# Patient Record
Sex: Male | Born: 2000 | Race: Black or African American | Hispanic: No | Marital: Single | Smoking: Never smoker
Health system: Southern US, Community
[De-identification: ages and names within clinical notes are randomized; demographics above are authoritative.]

## PROBLEM LIST (undated history)

## (undated) DIAGNOSIS — E669 Obesity, unspecified: Secondary | ICD-10-CM

## (undated) DIAGNOSIS — F8081 Childhood onset fluency disorder: Secondary | ICD-10-CM

## (undated) DIAGNOSIS — F39 Unspecified mood [affective] disorder: Secondary | ICD-10-CM

## (undated) DIAGNOSIS — R51 Headache: Secondary | ICD-10-CM

## (undated) DIAGNOSIS — F913 Oppositional defiant disorder: Secondary | ICD-10-CM

## (undated) DIAGNOSIS — E663 Overweight: Principal | ICD-10-CM

## (undated) DIAGNOSIS — R519 Headache, unspecified: Secondary | ICD-10-CM

## (undated) DIAGNOSIS — F909 Attention-deficit hyperactivity disorder, unspecified type: Secondary | ICD-10-CM

## (undated) DIAGNOSIS — E739 Lactose intolerance, unspecified: Secondary | ICD-10-CM

## (undated) DIAGNOSIS — J353 Hypertrophy of tonsils with hypertrophy of adenoids: Secondary | ICD-10-CM

## (undated) DIAGNOSIS — T7840XA Allergy, unspecified, initial encounter: Secondary | ICD-10-CM

## (undated) DIAGNOSIS — F319 Bipolar disorder, unspecified: Secondary | ICD-10-CM

## (undated) HISTORY — DX: Overweight: E66.3

## (undated) HISTORY — DX: Headache: R51

## (undated) HISTORY — PX: TONSILLECTOMY: SUR1361

## (undated) HISTORY — DX: Obesity, unspecified: E66.9

## (undated) HISTORY — PX: CIRCUMCISION: SHX1350

## (undated) HISTORY — PX: ADENOIDECTOMY: SUR15

## (undated) HISTORY — DX: Attention-deficit hyperactivity disorder, unspecified type: F90.9

## (undated) HISTORY — DX: Headache, unspecified: R51.9

## (undated) HISTORY — DX: Allergy, unspecified, initial encounter: T78.40XA

---
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
## 2019-11-09 ENCOUNTER — Ambulatory Visit: Payer: Medicaid Other

## 2020-12-24 ENCOUNTER — Encounter: Payer: Self-pay | Admitting: Pediatrics

## 2021-04-17 ENCOUNTER — Encounter (HOSPITAL_COMMUNITY): Payer: Self-pay

## 2021-04-17 ENCOUNTER — Other Ambulatory Visit: Payer: Self-pay

## 2021-04-17 ENCOUNTER — Emergency Department (HOSPITAL_COMMUNITY)
Admission: EM | Admit: 2021-04-17 | Discharge: 2021-04-17 | Disposition: A | Discharge status: Home / Self Care | Payer: Medicaid Other | Attending: Emergency Medicine | Admitting: Emergency Medicine

## 2021-04-17 DIAGNOSIS — Z7722 Contact with and (suspected) exposure to environmental tobacco smoke (acute) (chronic): Secondary | ICD-10-CM | POA: Insufficient documentation

## 2021-04-17 DIAGNOSIS — Z20822 Contact with and (suspected) exposure to covid-19: Secondary | ICD-10-CM | POA: Diagnosis not present

## 2021-04-17 DIAGNOSIS — J069 Acute upper respiratory infection, unspecified: Secondary | ICD-10-CM | POA: Insufficient documentation

## 2021-04-17 DIAGNOSIS — R0981 Nasal congestion: Secondary | ICD-10-CM | POA: Diagnosis present

## 2021-04-17 LAB — RESP PANEL BY RT-PCR (FLU A&B, COVID) ARPGX2
Influenza A by PCR: NEGATIVE
Influenza B by PCR: NEGATIVE
SARS Coronavirus 2 by RT PCR: NEGATIVE

## 2021-04-17 MED ORDER — ACETAMINOPHEN 325 MG PO TABS
650.0000 mg | ORAL_TABLET | Freq: Once | ORAL | Status: AC
Start: 2021-04-17 — End: 2021-04-17
  Administered 2021-04-17: 650 mg via ORAL
  Filled 2021-04-17: qty 2

## 2021-04-17 NOTE — ED Triage Notes (Signed)
Pt presents to ED with complaints of nasal congestion, runny nose and chills started yesterday.

## 2021-04-17 NOTE — Discharge Instructions (Signed)
°  We think what you have is a viral syndrome - the treatment for which is symptomatic relief only, and your body will fight the infection off in a few days. °We are prescribing you some meds for pain and fevers. °See your primary care doctor in 1 week if the symptoms dont improve. °

## 2021-04-17 NOTE — ED Provider Notes (Signed)
[facility] EMERGENCY DEPARTMENT Provider Note   CSN: [id number] Arrival date & time: 04/17/21  1809     History Chief Complaint  Patient presents with   Nasal Congestion    Steven French is a 20 y.o. male.  HPI     20 year old male comes in with chief complaint of nasal congestion.  He reports that he has been having some URI-like symptoms starting yesterday.  He has been having subjective fevers and chills.  No sick contacts.  No history of asthma. Past Medical History:  Diagnosis Date   ADHD (attention deficit hyperactivity disorder)    Allergy    Bipolar 1 disorder (HCC)    Headache    Lactose intolerance    Mood disorder (HCC)    Obesity    ODD (oppositional defiant disorder)    Overweight(278.02) 09/24/2012   Stuttering    receives speech therapy   Tonsillar and adenoid hypertrophy 10/2012   snores during sleep, occ. 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/02/2017   Perennial allergic rhinitis 09/02/2016   Chest pain, non-cardiac 08/24/2015   Slow transit constipation 03/27/2015   BMI (body mass index), pediatric, greater than or equal to 95% for age 67/01/2015   Need for vaccination 12/22/2014   Short stature 12/22/2014   Attention deficit hyperactivity disorder (ADHD), combined type 02/09/2014   Chronic tension-type headache, not intractable 02/09/2014   ADHD (attention deficit hyperactivity disorder), combined type 02/09/2014   Migraine without aura and without status migrainosus, not intractable 09/09/2013   Tension type headache 09/09/2013   Alteration of awareness 09/09/2013   Learning difficulty 09/09/2013   ODD (oppositional defiant disorder) 09/09/2013   Academic skill disorder 09/09/2013    Past Surgical History:  Procedure Laterality Date   ADENOIDECTOMY     CIRCUMCISION     TONSILLECTOMY     TONSILLECTOMY AND ADENOIDECTOMY N/A  10/25/2012   Procedure: TONSILLECTOMY AND ADENOIDECTOMY;  Surgeon: Darletta Moll, MD;  Location: [facility];  Service: ENT;  Laterality: N/A;       Family History  Problem Relation Age of Onset   Hypertension Mother    Hyperlipidemia Mother    Seizures Mother    Bipolar disorder Mother    Schizophrenia Mother    Depression Mother    Anxiety disorder Mother    Migraines Mother    Asthma Brother    Seizures Brother        1 Maternal 1/2 brother has seizures   Migraines Brother        1 Maternal 1/2 brother has migraines   ADD / ADHD Brother        2 Maternal 1/2 brothers have ADHD   Seizures Maternal Grandfather    ADD / ADHD Maternal Grandfather    Bipolar disorder Maternal Grandfather    Depression Maternal Grandfather    Seizures Other     Social History   Tobacco Use   Smoking status: Never    Passive exposure: Yes   Smokeless tobacco: Never   Tobacco comments:    mother smokes outside  Substance Use Topics   Alcohol use: No   Drug use: Yes    Types: Marijuana    Home Medications Prior to Admission medications   Medication Sig Start Date End Date Taking? Authorizing Provider  amphetamine-dextroamphetamine (ADDERALL XR) 10 MG 24 hr capsule Take 10 mg by mouth daily.  [provider]  cetirizine (ZYRTEC) 10 MG tablet Take one tablet at night for allergies 09/02/16   McDonell, Alfredia Client, MD  flintstones complete (FLINTSTONES) 60 MG chewable tablet Chew 1 tablet by mouth daily.    [provider]  fluticasone (FLONASE) 50 MCG/ACT nasal spray INSTILL 2 SPRAYS INTO EACH NOSTRIL DAILY. 10/15/17   McDonell, Alfredia Client, MD  guanFACINE (INTUNIV) 2 MG TB24 SR tablet Take 2 mg by mouth daily.    [provider]  loratadine-pseudoephedrine (CLARITIN-D 12 HOUR) 5-120 MG per tablet Take 1 tablet by mouth 2 (two) times daily. 06/19/14   Ivery Quale, PA-C  lurasidone (LATUDA) 80 MG TABS tablet Take 1 tablet (80 mg total) by mouth daily. 04/30/15    McDonell, Alfredia Client, MD  olopatadine (PATANOL) 0.1 % ophthalmic solution INSTILL 1 DROP INTO BOTH EYES TWICE DAILY. 10/15/17   McDonell, Alfredia Client, MD  polyethylene glycol powder (GLYCOLAX/MIRALAX) powder Take 17 g by mouth 2 (two) times daily. 03/27/15   Lurene Shadow, MD  topiramate (TOPAMAX) 25 MG tablet Take 1 tablet (25 mg total) by mouth at bedtime. 02/09/14   Keturah Shavers, MD    Allergies    Penicillins and Lactose intolerance (gi)  Review of Systems   Review of Systems  Constitutional:  Positive for activity change.  HENT:  Positive for congestion.   Respiratory:  Negative for shortness of breath.   Allergic/Immunologic: Negative for immunocompromised state.   Physical Exam Updated Vital Signs BP 135/89   Pulse (!) 102   Temp 99.5 F (37.5 C) (Oral)   Resp 18   Ht 5\' 3"  (1.6 m)   Wt 83.2 kg   SpO2 99%   BMI 32.49 kg/m   Physical Exam Vitals and nursing note reviewed.  Constitutional:      Appearance: He is well-developed.  HENT:     Head: Atraumatic.  Cardiovascular:     Rate and Rhythm: Normal rate.  Pulmonary:     Effort: Pulmonary effort is normal.  Musculoskeletal:     Cervical back: Neck supple.  Skin:    General: Skin is warm.  Neurological:     Mental Status: He is alert and oriented to person, place, and time.    ED Results / Procedures / Treatments   Labs (all labs ordered are listed, but only abnormal results are displayed) Labs Reviewed  RESP PANEL BY RT-PCR (FLU A&B, COVID) [id number]    EKG None  Radiology No results found.  Procedures Procedures   Medications Ordered in ED Medications  acetaminophen (TYLENOL) tablet 650 mg (650 mg Oral Given 04/17/21 2257)    ED Course  I have reviewed the triage vital signs and the nursing notes.  Pertinent labs & imaging results that were available during my care of the patient were reviewed by me and considered in my medical decision making (see chart for details).    MDM  Rules/Calculators/A&P                           20 year old male comes in with chief complaint of cough, congestion, subjective fevers and chills.  He has cervical lymphadenopathy, otherwise he does not have any concerning exam findings.  He is healthy.  COVID-19, flu test is negative.  Stable for discharge with likely a viral URI.  Final Clinical Impression(s) / ED Diagnoses Final diagnoses:  Viral upper respiratory tract infection    Rx / DC Orders ED Discharge Orders  None        Derwood Kaplan, MD 04/17/21 2328

## 2024-02-26 ENCOUNTER — Emergency Department (HOSPITAL_COMMUNITY)
Admission: EM | Admit: 2024-02-26 | Discharge: 2024-02-26 | Disposition: A | Discharge status: Home / Self Care | Payer: MEDICAID | Attending: Emergency Medicine | Admitting: Emergency Medicine

## 2024-02-26 ENCOUNTER — Emergency Department (HOSPITAL_COMMUNITY): Payer: MEDICAID

## 2024-02-26 DIAGNOSIS — M546 Pain in thoracic spine: Secondary | ICD-10-CM | POA: Insufficient documentation

## 2024-02-26 DIAGNOSIS — M549 Dorsalgia, unspecified: Secondary | ICD-10-CM

## 2024-02-26 DIAGNOSIS — M545 Low back pain, unspecified: Secondary | ICD-10-CM | POA: Diagnosis not present

## 2024-02-26 MED ORDER — NAPROXEN 500 MG PO TABS: 500.0000 mg | ORAL_TABLET | Freq: Two times a day (BID) | ORAL | 0 refills | Status: AC

## 2024-02-26 MED ORDER — LIDOCAINE 5 % EX PTCH
1.0000 | MEDICATED_PATCH | CUTANEOUS | Status: DC
  Administered 2024-02-26: 1 via TRANSDERMAL
  Filled 2024-02-26: qty 1

## 2024-02-26 MED ORDER — KETOROLAC TROMETHAMINE 30 MG/ML IJ SOLN
30.0000 mg | Freq: Once | INTRAMUSCULAR | Status: AC
  Administered 2024-02-26: 30 mg via INTRAMUSCULAR
  Filled 2024-02-26: qty 1

## 2024-02-26 MED ORDER — LIDOCAINE 5 % EX PTCH: 1.0000 | MEDICATED_PATCH | CUTANEOUS | 0 refills | Status: AC

## 2024-02-26 NOTE — ED Provider Notes (Signed)
 [place] EMERGENCY DEPARTMENT AT [facility] Provider Note   CSN: [id number] Arrival date & time: 02/26/24  1040     Patient presents with: Back Pain   Steven French is a 23 y.o. male with no prior orthopedic concerns presenting for evaluation of mid to lower midline back pain which has been present for the past 3 to 4 weeks.  Mother at bedside states he has had intermittent complaints of pain in his back since he was a child but over the past several weeks he has been more uncomfortable.  He denies any injuries, specifically no falls or heavy lifting.  He also has no weakness or numbness in his extremities and denies any urinary fecal incontinence or retention.  No fevers or chills, no IV DU.  His mother gave him a gabapentin tablet yesterday with equivocal symptom relief.   The history is provided by the patient and a parent.       Prior to Admission medications   Medication Sig Start Date End Date Taking? Authorizing Provider  naproxen  (NAPROSYN ) 500 MG tablet Take 1 tablet (500 mg total) by mouth 2 (two) times daily. 02/26/24  Yes Leif Loflin, PA-C  amphetamine-dextroamphetamine (ADDERALL XR) 10 MG 24 hr capsule Take 10 mg by mouth daily.    [provider]  cetirizine  (ZYRTEC ) 10 MG tablet Take one tablet at night for allergies 09/02/16   McDonell, Ronal Amble, MD  flintstones complete (FLINTSTONES) 60 MG chewable tablet Chew 1 tablet by mouth daily.    [provider]  fluticasone  (FLONASE ) 50 MCG/ACT nasal spray INSTILL 2 SPRAYS INTO EACH NOSTRIL DAILY. 10/15/17   McDonell, Ronal Amble, MD  guanFACINE (INTUNIV) 2 MG TB24 SR tablet Take 2 mg by mouth daily.    [provider]  loratadine -pseudoephedrine  (CLARITIN -D 12 HOUR) 5-120 MG per tablet Take 1 tablet by mouth 2 (two) times daily. 06/19/14   Armida Culver, PA-C  lurasidone  (LATUDA ) 80 MG TABS tablet Take 1 tablet (80 mg total) by mouth daily. 04/30/15   McDonell, Ronal Amble, MD  olopatadine   (PATANOL) 0.1 % ophthalmic solution INSTILL 1 DROP INTO BOTH EYES TWICE DAILY. 10/15/17   McDonell, Ronal Amble, MD  polyethylene glycol powder (GLYCOLAX /MIRALAX ) powder Take 17 g by mouth 2 (two) times daily. 03/27/15   Gnanasekaran, Kavithashree, MD  topiramate  (TOPAMAX ) 25 MG tablet Take 1 tablet (25 mg total) by mouth at bedtime. 02/09/14   Corinthia Blossom, MD    Allergies: Penicillins and Lactose intolerance (gi)    Review of Systems  Constitutional:  Negative for fever.  Respiratory:  Negative for shortness of breath.   Cardiovascular:  Negative for chest pain and leg swelling.  Gastrointestinal:  Negative for abdominal distention, abdominal pain and constipation.  Genitourinary:  Negative for difficulty urinating, dysuria, flank pain, frequency and urgency.  Musculoskeletal:  Positive for back pain. Negative for gait problem and joint swelling.  Skin:  Negative for rash.  Neurological:  Negative for weakness and numbness.    Updated Vital Signs BP (!) 148/80   Pulse 76   Temp 98.5 F (36.9 C)   Resp 18   Ht 5' 3 (1.6 m)   Wt 84.4 kg   SpO2 98%   BMI 32.95 kg/m   Physical Exam Vitals and nursing note reviewed.  Constitutional:      Appearance: He is well-developed.  HENT:     Head: Normocephalic.  Eyes:     Conjunctiva/sclera: Conjunctivae normal.  Cardiovascular:  Rate and Rhythm: Normal rate.     Pulses: Normal pulses.     Comments: Pedal pulses normal. Pulmonary:     Effort: Pulmonary effort is normal.  Abdominal:     General: Bowel sounds are normal. There is no distension.     Palpations: Abdomen is soft. There is no mass.  Musculoskeletal:        General: Normal range of motion.     Cervical back: Normal range of motion and neck supple.     Thoracic back: Tenderness present. No swelling, edema, deformity or spasms.     Lumbar back: Tenderness present. No swelling, edema or spasms.  Skin:    General: Skin is warm and dry.  Neurological:     Mental  Status: He is alert.     Sensory: No sensory deficit.     Motor: No tremor or atrophy.     Gait: Gait normal.     Deep Tendon Reflexes:     Reflex Scores:      Patellar reflexes are 2+ on the right side and 2+ on the left side.      Achilles reflexes are 2+ on the right side and 2+ on the left side.    Comments: No strength deficit noted in hip and knee flexor and extensor muscle groups.  Ankle flexion and extension intact.     (all labs ordered are listed, but only abnormal results are displayed) Labs Reviewed - No data to display  EKG: None  Radiology: DG Lumbar Spine Complete Result Date: 02/26/2024 CLINICAL DATA:  Lower back pain for 2 weeks without known injury. EXAM: LUMBAR SPINE - COMPLETE 4+ VIEW COMPARISON:  None Available. FINDINGS: There is no evidence of lumbar spine fracture. Alignment is normal. Intervertebral disc spaces are maintained. IMPRESSION: Negative. Electronically Signed   By: Lynwood Landy Raddle M.D.   On: 02/26/2024 14:02   DG Thoracic Spine 2 View Result Date: 02/26/2024 CLINICAL DATA:  Back pain for several weeks without known injury. EXAM: THORACIC SPINE 2 VIEWS COMPARISON:  None Available. FINDINGS: There is no evidence of thoracic spine fracture. Alignment is normal. No other significant bone abnormalities are identified. IMPRESSION: Negative. Electronically Signed   By: Lynwood Landy Raddle M.D.   On: 02/26/2024 14:01     Procedures   Medications Ordered in the ED  ketorolac  (TORADOL ) 30 MG/ML injection 30 mg (30 mg Intramuscular Given 02/26/24 1420)                                    Medical Decision Making No neuro deficit on exam or by history to suggest emergent or surgical presentation.  No risk factors for epidural abscess, spinal infection, no symptoms suggesting cauda equina.   Discussed worsened sx that should prompt immediate re-evaluation including distal weakness, bowel/bladder retention/incontinence.        Amount and/or Complexity of  Data Reviewed Radiology: ordered and independent interpretation performed.    Details: Evaluated thoracic and lumbar films, negative for acute findings.  Risk Prescription drug management.        Final diagnoses:  Acute midline back pain, unspecified back location    ED Discharge Orders          Ordered    naproxen  (NAPROSYN ) 500 MG tablet  2 times daily        02/26/24 1439  Birdena Clarity, PA-C 02/26/24 1443    Towana Ozell BROCKS, MD 02/26/24 1728

## 2024-02-26 NOTE — ED Triage Notes (Signed)
 Pt comes in for lower back pain. Pain has been there for the past couple of weeks. Pt denies injury or trauma to area.   Pt is A&Ox4. Pt is sitting on his right side. Mother of pt gave him a gabapentin to see if that would help but it did not.    Family hx of MS and nerve disorder.

## 2024-02-26 NOTE — Discharge Instructions (Addendum)
 Use medication prescribed for the next 10 days.  Also advise minimizing any activity that makes your back pain worse but do stay as active as you comfortably can such as simple walking but no heavy lifting or twisting of your torso.  Also recommend a heating pad applied to your area of pain for 20 minutes 3 times daily.  Plan to see your primary provider if your symptoms are not improving with this treatment plan.  Your x-rays today are negative for any acute findings in your thoracic or your lumbar spine.  I suspect this may be due to muscle strain.
